# Patient Record
Sex: Male | Born: 1980 | Race: Black or African American | Hispanic: No | Marital: Single | Smoking: Never smoker
Health system: Southern US, Community
[De-identification: ages and names within clinical notes are randomized; demographics above are authoritative.]

## PROBLEM LIST (undated history)

## (undated) DIAGNOSIS — E079 Disorder of thyroid, unspecified: Secondary | ICD-10-CM

## (undated) DIAGNOSIS — I10 Essential (primary) hypertension: Secondary | ICD-10-CM

## (undated) HISTORY — PX: CHOLECYSTECTOMY: SHX55

---

## 2008-05-20 ENCOUNTER — Emergency Department: Payer: Self-pay | Admitting: Emergency Medicine

## 2008-06-09 ENCOUNTER — Ambulatory Visit: Payer: Self-pay | Admitting: General Surgery

## 2008-06-16 ENCOUNTER — Ambulatory Visit: Payer: Self-pay | Admitting: General Surgery

## 2009-12-11 IMAGING — CT CT ABD-PELV W/ CM
1 of 2 series · 15 of 32 positions shown, 19 images · non-contrast
Comparison: No comparison

REASON FOR EXAM: (1) abdominal pain; (2) r/o appy
COMMENTS:

PROCEDURE:     CT  - CT ABDOMEN / PELVIS  W  - May 20, 2008  [DATE]
RESULT:     History: Pain
TECHNIQUE: Multiple axial images of the abdomen and pelvis were performed
from the lung bases to the pubic symphysis, with p.o. contrast and with 100
mL of [id number] intravenous contrast.

[Series 2: abdomen · axial · 0.72mm/px · z∈[-906,-501]mm · 15 of 89 slices shown, 19 images]
[im 4/89  soft-tissue]
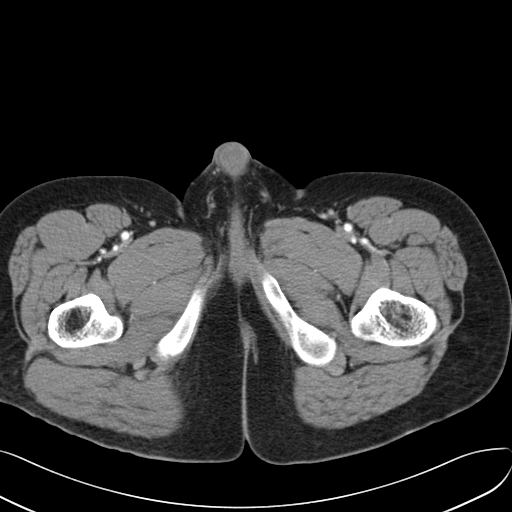
[im 4/89  bone]
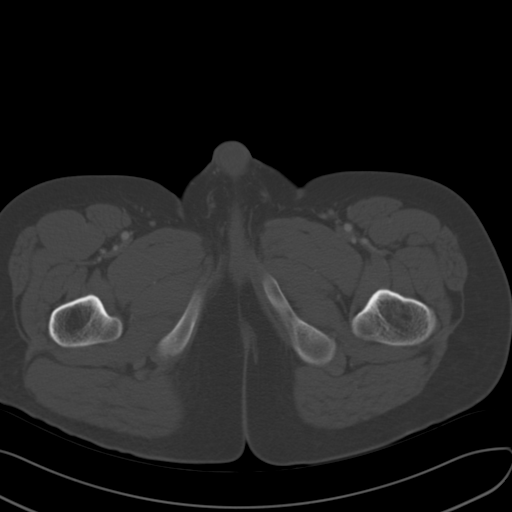
[im 11/89  soft-tissue]
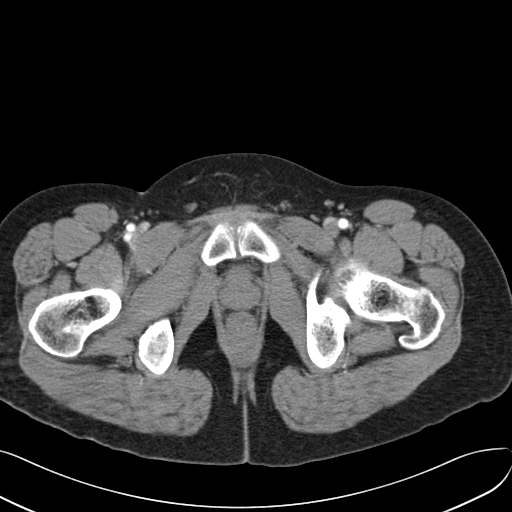
[im 18/89  soft-tissue]
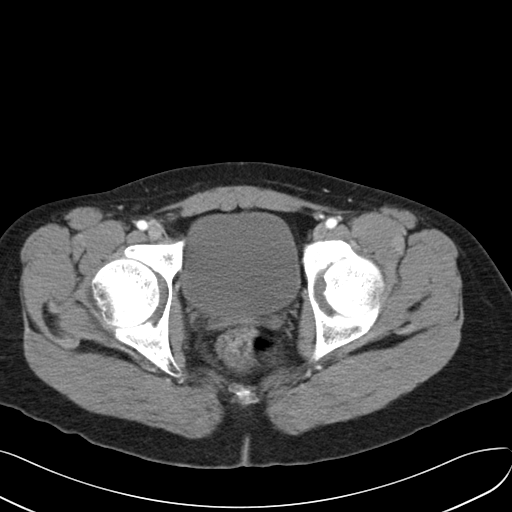
[im 25/89  soft-tissue]
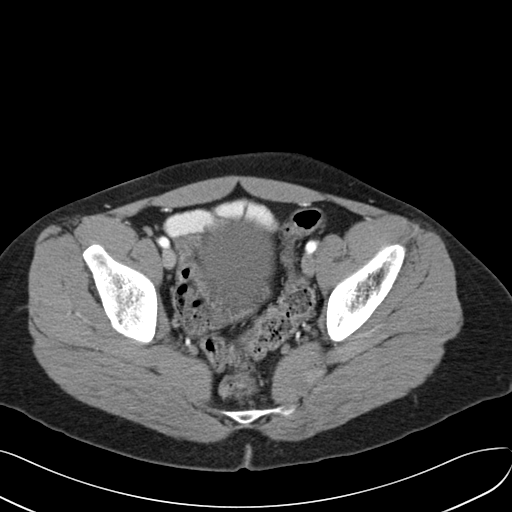
[im 32/89  soft-tissue]
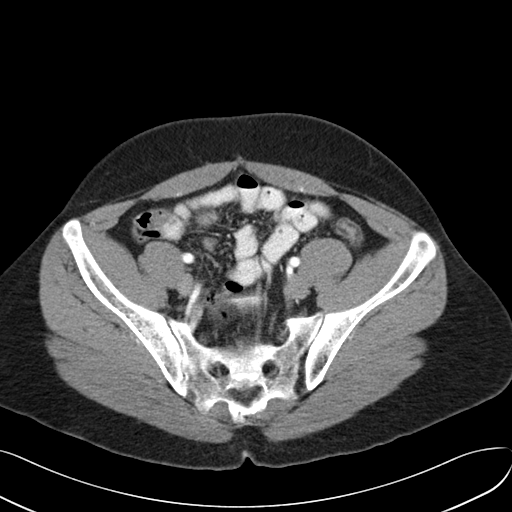
[im 39/89  soft-tissue]
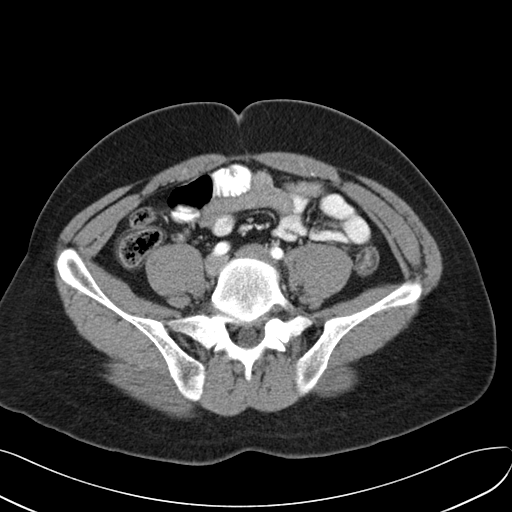
[im 46/89  soft-tissue]
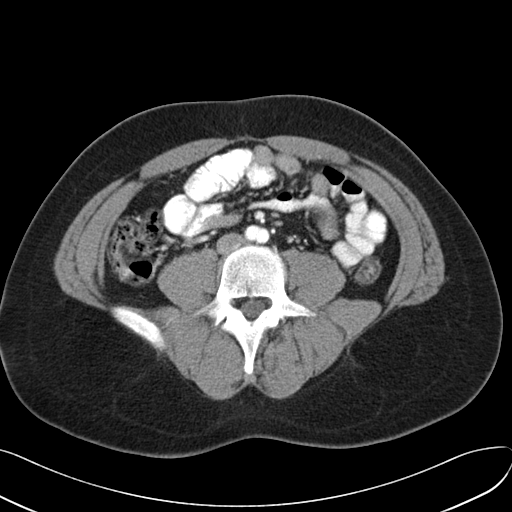
[im 50/89  soft-tissue]
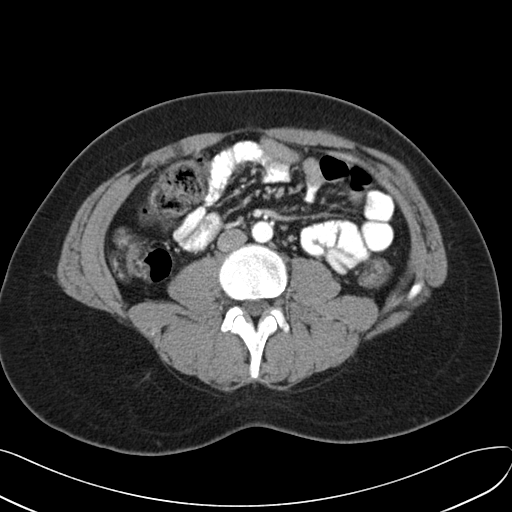
[im 57/89  soft-tissue]
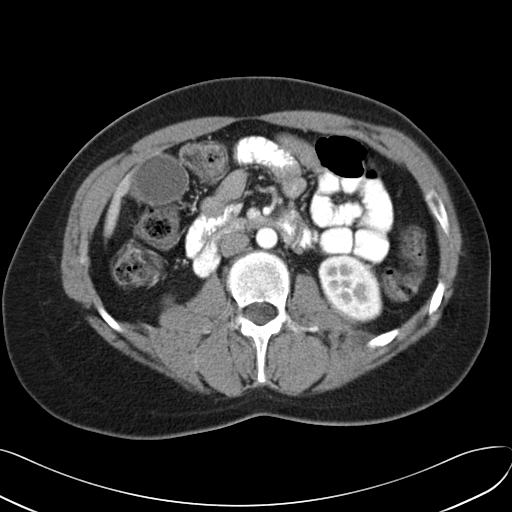
[im 57/89  bone]
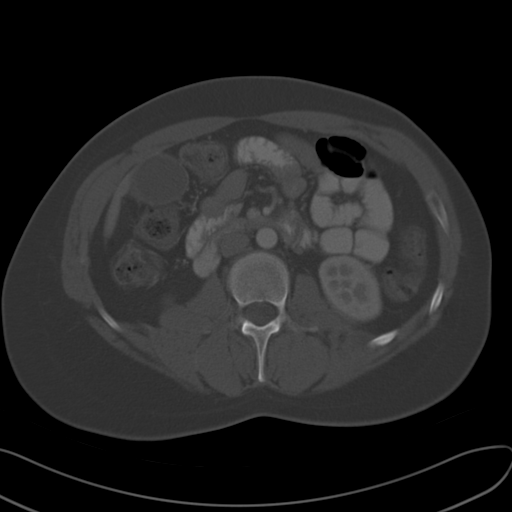
[im 64/89  soft-tissue]
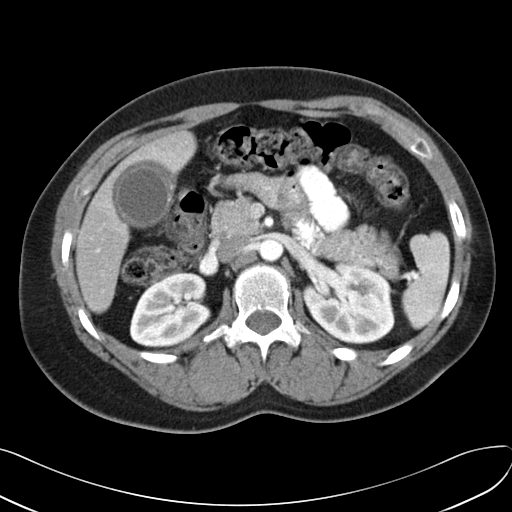
[im 71/89  soft-tissue]
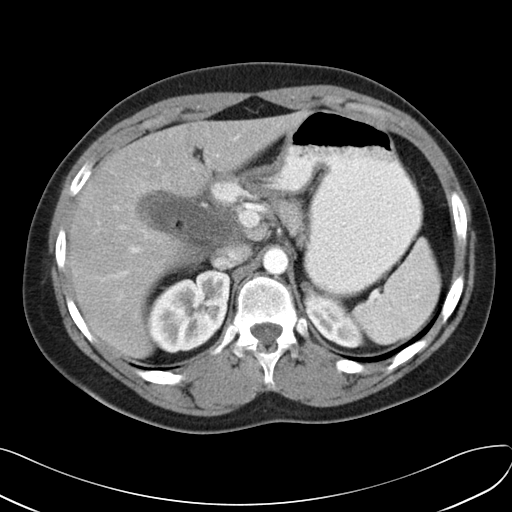
[im 74/89  lung]
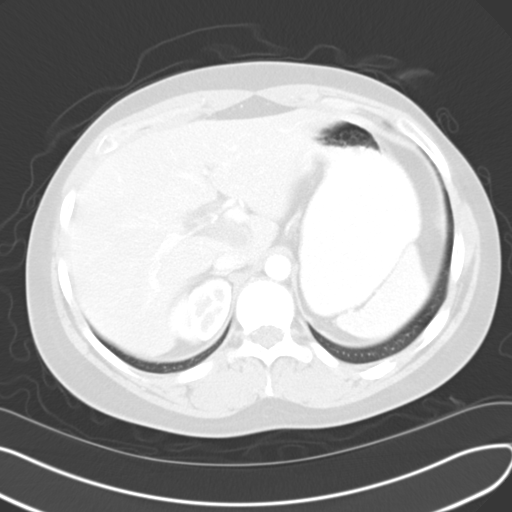
[im 78/89  soft-tissue]
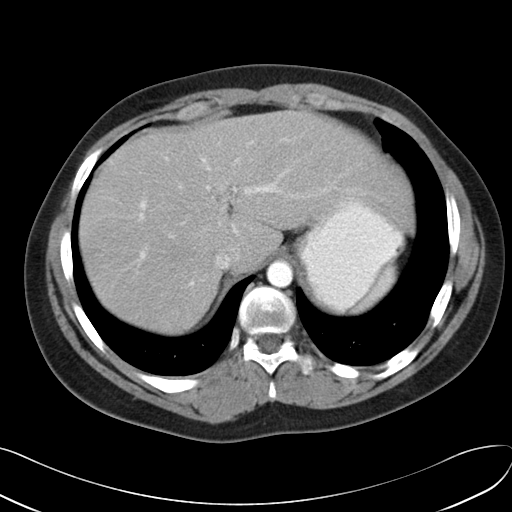
[im 78/89  lung]
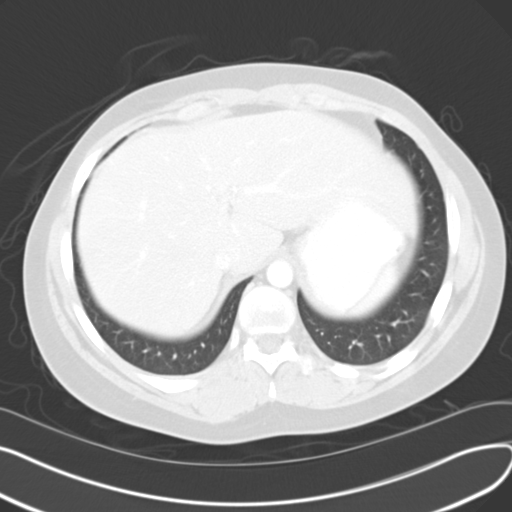
[im 81/89  lung]
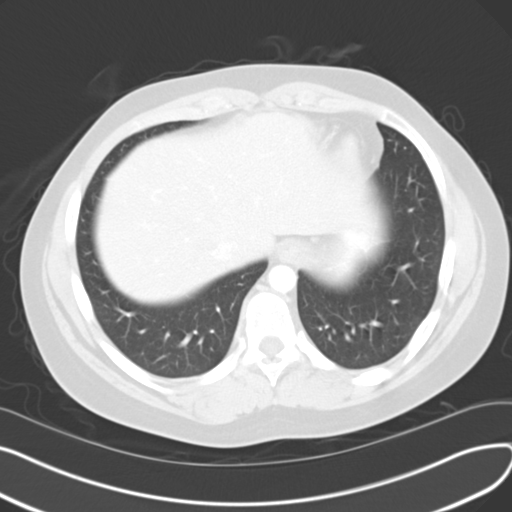
[im 85/89  soft-tissue]
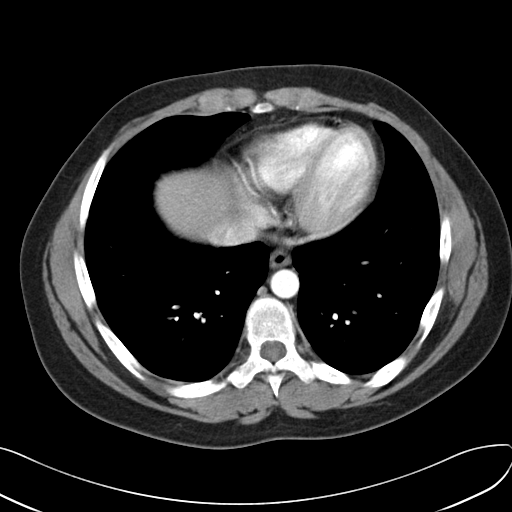
[im 85/89  lung]
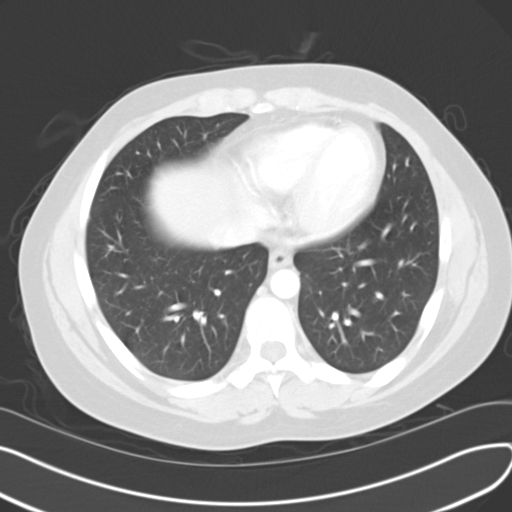

[15 of 32 positions shown; findings below may reference images not displayed]

FINDINGS: The lung bases are clear. There is no pneumothorax. The heart size is
normal.

The liver demonstrates no focal abnormality. There is no intrahepatic or
extrahepatic biliary ductal dilatation. The spleen demonstrates no focal
abnormality. The kidneys, adrenal glands, and pancreas are normal. The
bladder is unremarkable.

There are cholelithiasis. There is distention of the gallbladder. There is
gallbladder wall thickening.

The stomach, duodenum, small intestine, and large intestine demonstrate no
contrast extravasation or dilatation. A normal caliber air filled appendix
is visualized in the right lower quadrant without periappendiceal
inflammatory changes. There is no pneumoperitoneum, pneumatosis, or portal
venous gas. There is no abdominal or pelvic free fluid. There is no
lymphadenopathy.

The abdominal aorta is normal in caliber.

The osseous structures are unremarkable.
IMPRESSION: 1. Normal appendix.
2. Cholelithiasis with mild gallbladder wall thickening and gallbladder
distention concerning for acute cholecystitis.

## 2010-03-17 ENCOUNTER — Emergency Department: Payer: Self-pay | Admitting: Emergency Medicine

## 2019-04-17 ENCOUNTER — Ambulatory Visit: Payer: Medicaid Other | Attending: Internal Medicine

## 2019-04-17 ENCOUNTER — Other Ambulatory Visit: Payer: Self-pay

## 2019-04-17 DIAGNOSIS — Z23 Encounter for immunization: Secondary | ICD-10-CM

## 2019-04-17 NOTE — Progress Notes (Signed)
   Covid-19 Vaccination Clinic  Name:  Sevastian Witczak    MRN: [id number] DOB: 12/20/1980  04/17/2019  Mr. Zehner was observed post Covid-19 immunization for 15 minutes without incident. He was provided with Vaccine Information Sheet and instruction to access the V-Safe system.   Mr. Barret was instructed to call 911 with any severe reactions post vaccine: Marland Kitchen Difficulty breathing  . Swelling of face and throat  . A fast heartbeat  . A bad rash all over body  . Dizziness and weakness   Immunizations Administered    Name Date Dose VIS Date Route   Pfizer COVID-19 Vaccine 04/17/2019 10:13 AM 0.3 mL 12/31/2018 Intramuscular   Manufacturer: [employer], [employer]   Lot: [id number]   NDC: [address]-1

## 2019-05-10 ENCOUNTER — Ambulatory Visit: Payer: Medicaid Other

## 2019-05-11 ENCOUNTER — Ambulatory Visit: Payer: Medicaid Other | Attending: Internal Medicine

## 2019-05-11 DIAGNOSIS — Z23 Encounter for immunization: Secondary | ICD-10-CM

## 2019-05-11 NOTE — Progress Notes (Signed)
   Covid-19 Vaccination Clinic  Name:  Joshua Costa    MRN: [id number] DOB: [birth year]  05/11/2019  Joshua Costa was observed post Covid-19 immunization for 15 minutes without incident. He was provided with Vaccine Information Sheet and instruction to access the V-Safe system.   Joshua Costa was instructed to call 911 with any severe reactions post vaccine: Marland Kitchen Difficulty breathing  . Swelling of face and throat  . A fast heartbeat  . A bad rash all over body  . Dizziness and weakness   Immunizations Administered    Name Date Dose VIS Date Route   Pfizer COVID-19 Vaccine 05/11/2019  9:19 AM 0.3 mL 03/16/2018 Intramuscular   Manufacturer: [employer], [employer]   Lot: [id number]   NDC: [address]-6

## 2019-11-11 ENCOUNTER — Ambulatory Visit
Admission: EM | Admit: 2019-11-11 | Discharge: 2019-11-11 | Disposition: A | Discharge status: Home / Self Care | Payer: Medicaid Other | Attending: Family Medicine | Admitting: Family Medicine

## 2019-11-11 ENCOUNTER — Other Ambulatory Visit: Payer: Self-pay

## 2019-11-11 ENCOUNTER — Encounter: Payer: Self-pay | Admitting: Emergency Medicine

## 2019-11-11 DIAGNOSIS — T63441A Toxic effect of venom of bees, accidental (unintentional), initial encounter: Secondary | ICD-10-CM

## 2019-11-11 HISTORY — DX: Disorder of thyroid, unspecified: E07.9

## 2019-11-11 HISTORY — DX: Essential (primary) hypertension: I10

## 2019-11-11 MED ORDER — CETIRIZINE HCL 10 MG PO TABS: 10.0000 mg | ORAL_TABLET | Freq: Every day | ORAL | 0 refills | Status: AC

## 2019-11-11 MED ORDER — PREDNISONE 10 MG (21) PO TBPK: ORAL_TABLET | ORAL | 0 refills | Status: AC

## 2019-11-11 NOTE — ED Triage Notes (Signed)
Patient states that he was at the park yesterday and got stung by a hornet in his right hand.  Patient c/o redness, swelling and pain in his right hand.  Patient states that he has only taken ibuprofen for the pain.

## 2019-11-11 NOTE — ED Provider Notes (Signed)
[facility]    CSN: [id number] Arrival date & time: 11/11/19  1314      History   Chief Complaint Chief Complaint  Patient presents with  . Insect Bite   HPI   39 year old male presents with the above complaint.  Patient states that he was stung by a "hornet" yesterday.  Was stung on the right hand.  He reports redness, swelling, and pain of the right hand.  Swelling extends up the forearm.  He has taken ibuprofen for the discomfort.  No other medication interventions tried.  No fever.  No other complaints or concerns at this time.  Past Medical History:  Diagnosis Date  . Hypertension   . Thyroid disease    Past Surgical History:  Procedure Laterality Date  . CHOLECYSTECTOMY     Home Medications    Prior to Admission medications   Medication Sig Start Date End Date Taking? Authorizing Provider  hydrocortisone (CORTEF) 5 MG tablet  10/27/19  Yes [provider]  levothyroxine (SYNTHROID) 75 MCG tablet Take by mouth. 11/06/19 11/05/20 Yes [provider]  lisinopril (ZESTRIL) 20 MG tablet Take 20 mg by mouth daily. 09/28/19  Yes [provider]  testosterone (ANDROGEL) 50 MG/5GM (1%) GEL Place onto the skin. 10/30/19  Yes [provider]  cetirizine (ZYRTEC ALLERGY) 10 MG tablet Take 1 tablet (10 mg total) by mouth daily. 11/11/19   Tommie Sams, DO  predniSONE (STERAPRED UNI-PAK 21 TAB) 10 MG (21) TBPK tablet 6 tablets on day 1; decrease by 1 tablet daily until gone. 11/11/19   Tommie Sams, DO    Family History Family History  Problem Relation Age of Onset  . Healthy Mother   . Healthy Father     Social History Social History   Tobacco Use  . Smoking status: Never Smoker  . Smokeless tobacco: Never Used  Vaping Use  . Vaping Use: Never used  Substance Use Topics  . Alcohol use: Not Currently  . Drug use: Never     Allergies   Hydrochlorothiazide   Review of Systems Review of Systems  Skin:       Bee  sting.     Physical Exam Triage Vital Signs ED Triage Vitals  Enc Vitals Group     BP 11/11/19 1357 137/82     Pulse Rate 11/11/19 1357 83     Resp 11/11/19 1357 16     Temp 11/11/19 1357 98.7 F (37.1 C)     Temp Source 11/11/19 1357 Oral     SpO2 11/11/19 1357 100 %     Weight 11/11/19 1354 216 lb (98 kg)     Height 11/11/19 1354 5\' 11"  (1.803 m)     Head Circumference --      Peak Flow --      Pain Score 11/11/19 1353 3     Pain Loc --      Pain Edu? --      Excl. in GC? --    Updated Vital Signs BP 137/82 (BP Location: Left Arm)   Pulse 83   Temp 98.7 F (37.1 C) (Oral)   Resp 16   Ht 5\' 11"  (1.803 m)   Wt 98 kg   SpO2 100%   BMI 30.13 kg/m   Visual Acuity Right Eye Distance:   Left Eye Distance:   Bilateral Distance:    Right Eye Near:   Left Eye Near:    Bilateral Near:     Physical  Exam Vitals and nursing note reviewed.  Constitutional:      General: He is not in acute distress.    Appearance: Normal appearance. He is not ill-appearing.  HENT:     Head: Normocephalic and atraumatic.  Eyes:     General:        Right eye: No discharge.        Left eye: No discharge.     Conjunctiva/sclera: Conjunctivae normal.  Cardiovascular:     Heart sounds: No murmur heard.   Pulmonary:     Effort: Pulmonary effort is normal. No respiratory distress.  Skin:    Comments: Swelling of the dorsum of the hand and the distal forearm.  Mild erythema.  Neurological:     Mental Status: He is alert.  Psychiatric:        Mood and Affect: Mood normal.        Behavior: Behavior normal.    UC Treatments / Results  Labs (all labs ordered are listed, but only abnormal results are displayed) Labs Reviewed - No data to display  EKG   Radiology No results found.  Procedures Procedures (including critical care time)  Medications Ordered in UC Medications - No data to display  Initial Impression / Assessment and Plan / UC Course  I have reviewed the triage  vital signs and the nursing notes.  Pertinent labs & imaging results that were available during my care of the patient were reviewed by me and considered in my medical decision making (see chart for details).    39 year old male presents with a bee sting/hornet sting.  Placing on a short burst of prednisone.  Zyrtec as prescribed.  Advised ice.  Supportive care.  Final Clinical Impressions(s) / UC Diagnoses   Final diagnoses:  Bee sting, accidental or unintentional, initial encounter   Discharge Instructions   None    ED Prescriptions    Medication Sig Dispense Auth. Provider   predniSONE (STERAPRED UNI-PAK 21 TAB) 10 MG (21) TBPK tablet 6 tablets on day 1; decrease by 1 tablet daily until gone. 21 tablet Briseis Aguilera G, DO   cetirizine (ZYRTEC ALLERGY) 10 MG tablet Take 1 tablet (10 mg total) by mouth daily. 14 tablet Tommie Sams, DO     PDMP not reviewed this encounter.   Tommie Sams, [place] 11/11/19 1628
